# Patient Record
Sex: Male | Born: 1980 | Race: Black or African American | Hispanic: No | Marital: Single | Smoking: Current every day smoker
Health system: Southern US, Community
[De-identification: ages and names within clinical notes are randomized; demographics above are authoritative.]

## PROBLEM LIST (undated history)

## (undated) DIAGNOSIS — J302 Other seasonal allergic rhinitis: Secondary | ICD-10-CM

---

## 2014-10-04 ENCOUNTER — Emergency Department (HOSPITAL_COMMUNITY)
Admission: EM | Admit: 2014-10-04 | Discharge: 2014-10-04 | Disposition: A | Discharge status: Home / Self Care | Attending: Emergency Medicine | Admitting: Emergency Medicine

## 2014-10-04 ENCOUNTER — Encounter (HOSPITAL_COMMUNITY): Payer: Self-pay | Admitting: *Deleted

## 2014-10-04 ENCOUNTER — Emergency Department (HOSPITAL_COMMUNITY)

## 2014-10-04 DIAGNOSIS — R55 Syncope and collapse: Secondary | ICD-10-CM

## 2014-10-04 DIAGNOSIS — R200 Anesthesia of skin: Secondary | ICD-10-CM | POA: Insufficient documentation

## 2014-10-04 DIAGNOSIS — Z720 Tobacco use: Secondary | ICD-10-CM | POA: Diagnosis not present

## 2014-10-04 HISTORY — DX: Other seasonal allergic rhinitis: J30.2

## 2014-10-04 LAB — BASIC METABOLIC PANEL
ANION GAP: 4 — AB (ref 5–15)
BUN: 13 mg/dL (ref 6–20)
CO2: 28 mmol/L (ref 22–32)
Calcium: 9.3 mg/dL (ref 8.9–10.3)
Chloride: 107 mmol/L (ref 101–111)
Creatinine, Ser: 1.38 mg/dL — ABNORMAL HIGH (ref 0.61–1.24)
GFR calc Af Amer: >60 mL/min (ref >60)
GLUCOSE: 99 mg/dL (ref 65–99)
POTASSIUM: 3.6 mmol/L (ref 3.5–5.1)
Sodium: 139 mmol/L (ref 135–145)

## 2014-10-04 LAB — CBC WITH DIFFERENTIAL/PLATELET
BASOS ABS: 0 10*3/uL (ref 0.0–0.1)
Basophils Relative: 0 % (ref 0–1)
EOS PCT: 3 % (ref 0–5)
Eosinophils Absolute: 0.3 10*3/uL (ref 0.0–0.7)
HEMATOCRIT: 44.3 % (ref 39.0–52.0)
Hemoglobin: 14.6 g/dL (ref 13.0–17.0)
LYMPHS PCT: 34 % (ref 12–46)
Lymphs Abs: 3.2 10*3/uL (ref 0.7–4.0)
MCH: 28.3 pg (ref 26.0–34.0)
MCHC: 33 g/dL (ref 30.0–36.0)
MCV: 86 fL (ref 78.0–100.0)
Monocytes Absolute: 1.1 10*3/uL — ABNORMAL HIGH (ref 0.1–1.0)
Monocytes Relative: 12 % (ref 3–12)
NEUTROS ABS: 4.7 10*3/uL (ref 1.7–7.7)
Neutrophils Relative %: 51 % (ref 43–77)
PLATELETS: 206 10*3/uL (ref 150–400)
RBC: 5.15 MIL/uL (ref 4.22–5.81)
RDW: 13.8 % (ref 11.5–15.5)
WBC: 9.4 10*3/uL (ref 4.0–10.5)

## 2014-10-04 NOTE — Discharge Instructions (Signed)
The syncopal event that occurred while playing sports is potential concern for a heart problem. No sports or running until cleared by cardiology. Referral information provided cardiology locally. Return for any new or worse symptoms. Chest x-ray was negative labs without significant abnormalities cardiac monitoring without arrhythmia EKG without acute changes.

## 2014-10-04 NOTE — ED Notes (Signed)
Pt passed out while playing basketball, pt c/o HA and neck pain, pt unable to stand from stretcher to bed on arrival

## 2014-10-04 NOTE — ED Notes (Signed)
MD at bedside. 

## 2014-10-04 NOTE — ED Notes (Signed)
1 liter bolus of NS bolus started

## 2014-10-04 NOTE — ED Provider Notes (Signed)
CSN: [id number]     Arrival date & time 10/04/14  1937 History   First MD Initiated Contact with Patient 10/04/14 1941     Chief Complaint  Patient presents with  . Loss of Consciousness     (Consider location/radiation/quality/duration/timing/severity/associated sxs/prior Treatment) Patient is a 34 y.o. male presenting with syncope. The history is provided by the patient.  Loss of Consciousness Associated symptoms: no chest pain, no dizziness, no fever, no headaches, no nausea, no shortness of breath, no vomiting and no weakness    patient is a prison inmate. Patient passed out while playing basketball. In triage a mention headache and neck pain but denies that to me. Does not concerned about hurting anything does not know why he passed out. Patient felt fine earlier. Never had this happen before.  Past Medical History  Diagnosis Date  . Seasonal allergies    History reviewed. No pertinent past surgical history. History reviewed. No pertinent family history. Social History  Substance Use Topics  . Smoking status: Current Every Day Smoker  . Smokeless tobacco: None  . Alcohol Use: No    Review of Systems  Constitutional: Negative for fever.  HENT: Negative for congestion.   Eyes: Negative for visual disturbance.  Respiratory: Negative for shortness of breath.   Cardiovascular: Positive for syncope. Negative for chest pain.  Gastrointestinal: Negative for nausea, vomiting and abdominal pain.  Genitourinary: Negative for dysuria and hematuria.  Musculoskeletal: Negative for back pain and neck pain.  Skin: Negative for rash.  Neurological: Positive for syncope and numbness. Negative for dizziness, weakness and headaches.  Hematological: Does not bruise/bleed easily.      Allergies  Shellfish allergy  Home Medications   Prior to Admission medications   Not on File   BP 139/91 mmHg  Pulse 51  Temp(Src) 98.4 F (36.9 C) (Oral)  Resp 14  Ht  (1.956 m)  Wt 195  lb (88.451 kg)  BMI 23.12 kg/m2  SpO2 99% Physical Exam  Constitutional: He is oriented to person, place, and time. He appears well-developed and well-nourished. No distress.  HENT:  Head: Normocephalic and atraumatic.  Mouth/Throat: Oropharynx is clear and moist.  Eyes: Conjunctivae and EOM are normal. Pupils are equal, round, and reactive to light.  Neck: Normal range of motion. Neck supple.  Cardiovascular: Normal rate, regular rhythm and normal heart sounds.   No murmur heard. Pulmonary/Chest: Effort normal and breath sounds normal. No respiratory distress.  Abdominal: Soft. Bowel sounds are normal. There is no tenderness.  Musculoskeletal: Normal range of motion. He exhibits no edema or tenderness.  Neurological: He is alert and oriented to person, place, and time. No cranial nerve deficit. He exhibits normal muscle tone. Coordination normal.  Skin: Skin is warm. No rash noted.  Nursing note and vitals reviewed.   ED Course  Procedures (including critical care time) Labs Review Labs Reviewed  CBC WITH DIFFERENTIAL/PLATELET - Abnormal; Notable for the following:    Monocytes Absolute 1.1 (*)    All other components within normal limits  BASIC METABOLIC PANEL - Abnormal; Notable for the following:    Creatinine, Ser 1.38 (*)    Anion gap 4 (*)    All other components within normal limits   Results for orders placed or performed during the hospital encounter of 10/04/14  CBC with Differential  Result Value Ref Range   WBC 9.4 4.0 - 10.5 K/uL   RBC 5.15 4.22 - 5.81 MIL/uL   Hemoglobin 14.6 13.0 - 17.0 g/dL  HCT 44.3 39.0 - 52.0 %   MCV 86.0 78.0 - 100.0 fL   MCH 28.3 26.0 - 34.0 pg   MCHC 33.0 30.0 - 36.0 g/dL   RDW 16.1 09.6 - 04.5 %   Platelets 206 150 - 400 K/uL   Neutrophils Relative % 51 43 - 77 %   Neutro Abs 4.7 1.7 - 7.7 K/uL   Lymphocytes Relative 34 12 - 46 %   Lymphs Abs 3.2 0.7 - 4.0 K/uL   Monocytes Relative 12 3 - 12 %   Monocytes Absolute 1.1 (H) 0.1  - 1.0 K/uL   Eosinophils Relative 3 0 - 5 %   Eosinophils Absolute 0.3 0.0 - 0.7 K/uL   Basophils Relative 0 0 - 1 %   Basophils Absolute 0.0 0.0 - 0.1 K/uL  Basic metabolic panel  Result Value Ref Range   Sodium 139 135 - 145 mmol/L   Potassium 3.6 3.5 - 5.1 mmol/L   Chloride 107 101 - 111 mmol/L   CO2 28 22 - 32 mmol/L   Glucose, Bld 99 65 - 99 mg/dL   BUN 13 6 - 20 mg/dL   Creatinine, Ser 4.09 (H) 0.61 - 1.24 mg/dL   Calcium 9.3 8.9 - 81.1 mg/dL   GFR calc non Af Amer >60 >60 mL/min   GFR calc Af Amer >60 >60 mL/min   Anion gap 4 (L) 5 - 15     Imaging Review Dg Chest 2 View  10/04/2014   CLINICAL DATA:  Syncope while playing basketball. Headache and neck pain.  EXAM: CHEST  2 VIEW  COMPARISON:  None.  FINDINGS: The cardiomediastinal contours are normal. The lungs are clear. Pulmonary vasculature is normal. No consolidation, pleural effusion, or pneumothorax. No acute osseous abnormalities are seen.  IMPRESSION: No acute pulmonary process.   Electronically Signed   By: Rubye Oaks M.D.   On: 10/04/2014 21:25   I have personally reviewed and evaluated these images and lab results as part of my medical decision-making.   EKG Interpretation   Date/Time:  Friday October 04 2014 20:09:42 EDT Ventricular Rate:  61 PR Interval:  179 QRS Duration: 95 QT Interval:  389 QTC Calculation: 392 R Axis:   88 Text Interpretation:  Sinus rhythm Consider left atrial enlargement ST  elev, probable normal early repol pattern No previous ECGs available  Confirmed by Soledad Budreau  MD, Soundra Lampley [phone]) on 10/04/2014 8:15:38 PM      MDM   Final diagnoses:  Syncope, unspecified syncope type    Patient with a syncopal episode while playing basketball. Patient in triage mentioned headache and neck pain but denied any of that to me. Is not concerned about anything that got hurt. Does have numbness to of the of left leg in a glovelike fashion which does not fit none neurological pattern. Main  concern would be the syncope and a fairly young black male. EKG without any acute changes also no of arrhythmias on cardiac monitoring chest x-rays negative. Basic labs without any significant abnormalities. However recommend cardiology follow-up for echocardiogram. To rule out any congenital heart problems that could've led to the syncope. Patient given of restrictive note for no strenuous activity no running nose sports or gym class. Referral to cardiology provided. However patient is prison systems so he maybe be referred within their system.    Vanetta Mulders, MD 10/04/14 2141

## 2014-10-12 ENCOUNTER — Emergency Department (HOSPITAL_COMMUNITY)

## 2014-10-12 ENCOUNTER — Emergency Department (HOSPITAL_COMMUNITY)
Admission: EM | Admit: 2014-10-12 | Discharge: 2014-10-12 | Disposition: A | Discharge status: Home / Self Care | Attending: Emergency Medicine | Admitting: Emergency Medicine

## 2014-10-12 ENCOUNTER — Encounter (HOSPITAL_COMMUNITY): Payer: Self-pay | Admitting: *Deleted

## 2014-10-12 DIAGNOSIS — Z79899 Other long term (current) drug therapy: Secondary | ICD-10-CM | POA: Diagnosis not present

## 2014-10-12 DIAGNOSIS — R002 Palpitations: Secondary | ICD-10-CM

## 2014-10-12 DIAGNOSIS — R079 Chest pain, unspecified: Secondary | ICD-10-CM | POA: Diagnosis present

## 2014-10-12 DIAGNOSIS — Z720 Tobacco use: Secondary | ICD-10-CM | POA: Insufficient documentation

## 2014-10-12 LAB — CBC
HEMATOCRIT: 45 % (ref 39.0–52.0)
Hemoglobin: 14.8 g/dL (ref 13.0–17.0)
MCH: 28.3 pg (ref 26.0–34.0)
MCHC: 32.9 g/dL (ref 30.0–36.0)
MCV: 86 fL (ref 78.0–100.0)
PLATELETS: 197 10*3/uL (ref 150–400)
RBC: 5.23 MIL/uL (ref 4.22–5.81)
RDW: 13.7 % (ref 11.5–15.5)
WBC: 7 10*3/uL (ref 4.0–10.5)

## 2014-10-12 LAB — BASIC METABOLIC PANEL
Anion gap: 6 (ref 5–15)
BUN: 12 mg/dL (ref 6–20)
CALCIUM: 9 mg/dL (ref 8.9–10.3)
CO2: 29 mmol/L (ref 22–32)
CREATININE: 1.06 mg/dL (ref 0.61–1.24)
Chloride: 105 mmol/L (ref 101–111)
GFR calc Af Amer: >60 mL/min (ref >60)
Glucose, Bld: 98 mg/dL (ref 65–99)
POTASSIUM: 4.1 mmol/L (ref 3.5–5.1)
SODIUM: 140 mmol/L (ref 135–145)

## 2014-10-12 LAB — TROPONIN I

## 2014-10-12 LAB — D-DIMER, QUANTITATIVE (NOT AT ARMC): D DIMER QUANT: 0.78 ug{FEU}/mL — AB (ref 0.00–0.48)

## 2014-10-12 MED ORDER — IOHEXOL 350 MG/ML SOLN
100.0000 mL | Freq: Once | INTRAVENOUS | Status: AC | PRN
  Administered 2014-10-12: 100 mL via INTRAVENOUS

## 2014-10-12 NOTE — ED Notes (Signed)
Pt was recently seen for episode of syncope. Pt states he felt his heart fluttering last night with continuation into today with chest pain starting this morning. Pt states he has no chest pain at the moment but does feel fluttering in his chest. NAD noted. Pt is accompanied by jail officers.

## 2014-10-12 NOTE — ED Notes (Signed)
Nurse gave report to nurse dingle at [facility].

## 2014-10-12 NOTE — Discharge Instructions (Signed)
°Emergency Department Resource Guide °1) Find a Doctor and Pay Out of Pocket °Although you won't have to find out who is covered by your insurance plan, it is a good idea to ask around and get recommendations. You will then need to call the office and see if the doctor you have chosen will accept you as a new patient and what types of options they offer for patients who are self-pay. Some doctors offer discounts or will set up payment plans for their patients who do not have insurance, but you will need to ask so you aren't surprised when you get to your appointment. ° °2) Contact Your Local Health Department °Not all health departments have doctors that can see patients for sick visits, but many do, so it is worth a call to see if yours does. If you don't know where your local health department is, you can check in your phone book. The CDC also has a tool to help you locate your state's health department, and many state websites also have listings of all of their local health departments. ° °3) Find a Walk-in Clinic °If your illness is not likely to be very severe or complicated, you may want to try a walk in clinic. These are popping up all over the country in pharmacies, drugstores, and shopping centers. They're usually staffed by nurse practitioners or physician assistants that have been trained to treat common illnesses and complaints. They're usually fairly quick and inexpensive. However, if you have serious medical issues or chronic medical problems, these are probably not your best option. ° °No Primary Care Doctor: °- Call Health Connect at  832-8000 - they can help you locate a primary care doctor that  accepts your insurance, provides certain services, etc. °- Physician Referral Service- 1-800-533-3463 ° °Chronic Pain Problems: °Organization         Address  Phone   Notes  °[facility]  ([phone]) [phone] Patients need to be referred by their primary care doctor.  ° °Medication  Assistance: °Organization         Address  Phone   Notes  °[place] [facility] [address] °[place], [place] [address] (336) 641-8030 --Must be a resident of [place] °-- Must have NO insurance coverage whatsoever (no Medicaid/ Medicare, etc.) °-- The pt. MUST have a primary care doctor that directs their care regularly and follows them in the community °  °[facility]  (866) 331-1348   °[employer]  (888) 892-1162   ° °Agencies that provide inexpensive medical care: °Organization         Address  Phone   Notes  °[facility]  (336) 832-8035   °[facility]    (336) 832-7272   °[facility] [address] °[place], [place] [address] (336) 832-4777   °[facility] [address], °[place] (336) 271-4999   °[facility]    (336) 373-0678   °[facility]    (336) 272-1050   °[facility] ° [address], [place] Phone:  (336) 832-4444, Fax:  (336) 832-4440 Hours of Operation:  9 am - 6 pm, M-F.  Also accepts Medicaid/Medicare and self-pay.  °[facility] ° [address], [place] Phone: (336) [phone], Fax: (336) [phone]. Hours of Operation:  8:30 am - 5:30 pm, M-F.  Also accepts Medicaid and self-pay.  °[facility] [place] [address], [place] Phone: (336) 878-6027   °[facility] [address], [place], [place] (336)723-1848, Ext. 123 Mondays & Thursdays: 7-9 AM.  First 15 patients are seen on a first come, first serve basis. °  ° °Medicaid-accepting [place] Providers: ° °Organization         Address  Phone   Notes  °[facility] [address], [place] (336) 641-2100 Also accepts self-pay patients.  °[facility] [address], [place] ° (336) 856-9996   °[facility] [address], [place]  (336) 288-8857   °[facility] [address] [place] [address], [place] (336) 299-7000   °Veita Bland [address], [place]  ° (336) 373-1557 Only accepts [place] Access Medicaid patients after they have their name applied to their card.  ° °Self-Pay (no insurance) in [place]: ° °Organization         Address  Phone   Notes  °Sickle Cell Patients, [facility] [address], [place] (336) 832-1970   °[facility] [address], [place] (336) 832-4400   °[facility] ° [address], [place] (336) 992-4800   °[facility]/Dr. Osei-Bonsu ° [address] [place] [address], [place] or [address], [place] (336) [phone] Phone number for both [place] and [place] locations is the same.  °[facility] [address], [place] (336) 299-0000   °[facility] [address] [place] [address], [place] or [address] (336) 852-7530 °(336) 878-2260   °[facility] [address], [place] (336) 350-1642, phone; (336) 294-5005, fax Sees patients 1st and 3rd Saturday of every month.  Must not qualify for public or private insurance (i.e. Medicaid, Medicare, [place] Health Choice, Veterans' Benefits) • Household income should be no more than 200% of the poverty level •The clinic cannot treat you if you are pregnant or think you are pregnant • Sexually transmitted diseases are not treated at the clinic.  ° ° °Dental Care: °Organization         Address  Phone  Notes  °[place] [facility] [address], [place] (336) 641-6152 Accepts children up to age 21 who are enrolled in Medicaid or [place] Health Choice; pregnant women with a Medicaid card; and children who have applied for Medicaid or [place] Health Choice, but were declined, whose parents can pay a reduced fee at time of service.  °[place]  [facility] [place]  [address], [place] (336) 641-7733 Accepts children up to age 21 who are enrolled in Medicaid or [place] Health Choice; pregnant women with a Medicaid card; and children who have applied for Medicaid or [place] Health Choice, but were declined, whose parents can pay a reduced fee at time of service.  °[facility] ° [address], [place] (336) 641-4533 Patients are seen by appointment only. Walk-ins are not accepted. [facility] will see patients 18 years of age and older. °Monday - Tuesday (8am-5pm) °Most Wednesdays (8:30-5pm) °$30 per visit, cash only  °[facility] ° [address], [place] (336) 641-4533 Patients are seen by appointment only. Walk-ins are not accepted. [facility] will see patients 18 years of age and older. °One   Wednesday Evening (Monthly: Volunteer Based).  $30 per visit, cash only  °[facility]  (919) [phone] for adults; Children under age 4, call Graduate Pediatric Dentistry at ([phone]) [phone]. Children aged 4-14, please call ([phone]) [phone] to request a pediatric application. ° Dental services are provided in all areas of dental care including fillings, crowns and bridges, complete and partial dentures, implants, gum treatment, root canals, and extractions. Preventive care is also provided. Treatment is provided to both adults and children. °Patients are selected via a lottery and there is often a waiting list. °  °[facility] [address], °[place] ° (336) 763-8833 [email or web address] °  °[facility] [address], [place], [place] (336)723-1848, Ext. 123 Second and Fourth Thursday of each month, opens at 6:30 AM; Clinic ends at 9 AM.  Patients are seen on a first-come first-served basis, and a limited number are seen during each clinic.  ° °[facility] ° [address], [place], [place] (336) [phone]    Eligibility Requirements °You must have lived in [place], [place], or [place] counties for at least the last three months. °  You cannot be eligible for state or federal sponsored healthcare insurance, including Veterans Administration, Medicaid, or Medicare. °  You generally cannot be eligible for healthcare insurance through your employer.  °  How to apply: °Eligibility screenings are held every Tuesday and Wednesday afternoon from 1:00 pm until 4:00 pm. You do not need an appointment for the interview!  °[facility] [address], [place], [place] 336-631-2330   °[facility]  336-342-8273   °[facility]  336-703-3100   °[facility]  336-570-6415   ° °Behavioral Health Resources in the Community: °Intensive Outpatient Programs °Organization         Address  Phone  Notes  °[facility] [address], [place], [place] 336-878-6098   °[facility] [address], [place], [place] 336-832-9800   °[facility] [address], [place], [place] ° 336-882-2125   °[facility] [address],  °[place], [place] 1-800-853-5163 or 336-641-4981   °Substance Abuse Resources °Organization         Address  Phone  Notes  °[facility]  336-882-2125   °[facility]  336-784-9470   °[facility]  336-285-9073   °[facility]  336-845-3988   °Residential & Outpatient Substance Abuse Program  1-800-659-3381   °Psychological Services °Organization         Address  Phone  Notes  °[facility]  336- 832-9600   °[facility]  336- 378-7881   °[facility] [address], [place] 1-800-853-5163 or 336-641-4981   ° °Mobile Crisis Teams °Organization         Address  Phone  Notes  °[facility], Mobile Crisis Care Unit  1-877-626-1772   °Assertive °[facility] ° [address]  [place], [place] 336-834-9664   °Sharon DeEsch [address] °[place] 336-554-5454   ° °Self-Help/Support Groups °Organization         Address  Phone             Notes  °[facility]  - variety of support groups  336- 373-1402 Call for more information  °Narcotics Anonymous (NA), [facility] [address], °[place]  2 meetings at this location  ° °  Residential Treatment Programs Organization         Address  Phone  Notes  [facility] [address]    [place]  4-098-119-1478   [facility]  [address] [place] [address], [place], [place] 308-657-8469   [facility] [address] [place], [place] 629-528-4132 Admissions: 8am-3pm M-F  [facility] [address],    [place], [place] 440-102-7253   [facility] [address] [place], [place], [place] 664-403-4742   [facility] [address],  [place], [place] 595-638-7564   [facility] - Intensive Outpatient [address], [place], [place] 332-951-8841   [facility] [address] [place]  [place], [place] 6-606-301-6010 or 902 228 3933   [facility] ([facility] [address], [place], [place] 025-427-0623 Accepts Medicaid  [facility] [address]  [place] 7-628-315-1761 Substance Abuse/Addiction Treatment   [facility]         Address  Phone  Notes  [facility]  431-245-3943   Angie Fava, PhD [address] [place], [place]   (938)233-0209 or 8020499614   [facility]   [address] [place], [place] 438-675-1774   [facility] [address] [place], [place] [phone] Insurance/Medicaid/sponsorship through [facility] [address]                                    [place], [place] [phone] Therapy/tele-psych/case    [facility] [address], [place] (228)[phone]    Dr. Lolly Mustache  [phone]   [facility]  [employer] [facility] Dept. 1) [address] [place] 2) [address] [place] 3)  [address], [place] [phone] (424)[phone]  [phone]   [facility] Child Abuse Hotline 661-403-8064 or 650-792-8325 (After Hours)      Avoid avoid caffinated products, such as teas, colas, coffee, chocolate. Avoid over the counter cold medicines, herbal or "natural vitamin" products, and illicit drugs because they can contain stimulants.  Call your regular medical doctor and the Cardiologist on Monday to schedule a follow up appointment this week.  Return to the Emergency Department immediately if worsening.

## 2014-10-12 NOTE — ED Provider Notes (Signed)
CSN: [id number]     Arrival date & time 10/12/14  1509 History   First MD Initiated Contact with Patient 10/12/14 1530     Chief Complaint  Patient presents with  . Chest Pain      HPI Pt was seen at 1535. Per pt, c/o gradual onset and persistence of multiple intermittent episodes of "palpitations" since overnight last night. Pt states his "heart has been fluttering on and off." Has been associated with chest "pain." Denies SOB/cough, no abd pain, no N/V/D, no lightheadedness.    Past Medical History  Diagnosis Date  . Seasonal allergies    History reviewed. No pertinent past surgical history.  Social History  Substance Use Topics  . Smoking status: Current Every Day Smoker  . Smokeless tobacco: None  . Alcohol Use: No    Review of Systems ROS: Statement: All systems negative except as marked or noted in the HPI; Constitutional: Negative for fever and chills. ; ; Eyes: Negative for eye pain, redness and discharge. ; ; ENMT: Negative for ear pain, hoarseness, nasal congestion, sinus pressure and sore throat. ; ; Cardiovascular: +palpitations, CP. Negative for diaphoresis, dyspnea and peripheral edema. ; ; Respiratory: Negative for cough, wheezing and stridor. ; ; Gastrointestinal: Negative for nausea, vomiting, diarrhea, abdominal pain, blood in stool, hematemesis, jaundice and rectal bleeding. . ; ; Genitourinary: Negative for dysuria, flank pain and hematuria. ; ; Musculoskeletal: Negative for back pain and neck pain. Negative for swelling and trauma.; ; Skin: Negative for pruritus, rash, abrasions, blisters, bruising and skin lesion.; ; Neuro: Negative for headache, lightheadedness and neck stiffness. Negative for weakness, altered level of consciousness , altered mental status, extremity weakness, paresthesias, involuntary movement, seizure and syncope.      Allergies  Shellfish allergy  Home Medications   Prior to Admission medications   Medication Sig Start Date End Date  Taking? Authorizing Provider  cetirizine (ZYRTEC) 10 MG tablet Take 10 mg by mouth daily.   Yes Historical Provider, MD  ranitidine (ZANTAC) 300 MG tablet Take 300 mg by mouth 2 (two) times daily.   Yes Historical Provider, MD  terbinafine (LAMISIL) 250 MG tablet Take 250 mg by mouth daily.   Yes Historical Provider, MD   BP 148/84 mmHg  Pulse 67  Temp(Src) 97.9 F (36.6 C) (Oral)  Resp 16  Ht 6\' 6"  (1.981 m)  Wt 196 lb (88.905 kg)  BMI 22.65 kg/m2  SpO2 100%   16:04:39 Orthostatic Vital Signs LR  Orthostatic Lying  - BP- Lying: 148/84 mmHg ; Pulse- Lying: 63  Orthostatic Sitting - BP- Sitting: 145/92 mmHg ; Pulse- Sitting: 68  Orthostatic Standing at 0 minutes - BP- Standing at 0 minutes: 147/92 mmHg ; Pulse- Standing at 0 minutes: 66      Physical Exam  1540: Physical examination:  Nursing notes reviewed; Vital signs and O2 SAT reviewed;  Constitutional: Well developed, Well nourished, Well hydrated, In no acute distress; Head:  Normocephalic, atraumatic; Eyes: EOMI, PERRL, No scleral icterus; ENMT: Mouth and pharynx normal, Mucous membranes moist; Neck: Supple, Full range of motion, No lymphadenopathy; Cardiovascular: Regular rate and rhythm, No murmur, rub, or gallop. HR 60's/70's during my exam; Respiratory: Breath sounds clear & equal bilaterally, No rales, rhonchi, wheezes.  Speaking full sentences with ease, Normal respiratory effort/excursion; Chest: Nontender, Movement normal; Abdomen: Soft, Nontender, Nondistended, Normal bowel sounds; Genitourinary: No CVA tenderness; Extremities: Pulses normal, No tenderness, No edema, No calf edema or asymmetry.; Neuro: AA&Ox3, Major CN grossly intact.  Speech  clear. No gross focal motor or sensory deficits in extremities.; Skin: Color normal, Warm, Dry.   ED Course  Procedures (including critical care time) Labs Review   Imaging Review  I have personally reviewed and evaluated these images and lab results as part of my medical  decision-making.   EKG Interpretation   Date/Time:  Saturday October 12 2014 15:17:22 EDT Ventricular Rate:  60 PR Interval:  172 QRS Duration: 85 QT Interval:  391 QTC Calculation: 391 R Axis:   88 Text Interpretation:  Sinus rhythm Normal ECG Confirmed by POLLINA  MD,  CHRISTOPHER [phone]) on 10/12/2014 3:19:34 PM      MDM  MDM Reviewed: previous chart, nursing note and vitals Reviewed previous: labs and ECG Interpretation: labs, ECG, x-ray and CT scan   Results for orders placed or performed during the hospital encounter of 10/12/14  Basic metabolic panel  Result Value Ref Range   Sodium 140 135 - 145 mmol/L   Potassium 4.1 3.5 - 5.1 mmol/L   Chloride 105 101 - 111 mmol/L   CO2 29 22 - 32 mmol/L   Glucose, Bld 98 65 - 99 mg/dL   BUN 12 6 - 20 mg/dL   Creatinine, Ser 6.04 0.61 - 1.24 mg/dL   Calcium 9.0 8.9 - 54.0 mg/dL   GFR calc non Af Amer >60 >60 mL/min   GFR calc Af Amer >60 >60 mL/min   Anion gap 6 5 - 15  CBC  Result Value Ref Range   WBC 7.0 4.0 - 10.5 K/uL   RBC 5.23 4.22 - 5.81 MIL/uL   Hemoglobin 14.8 13.0 - 17.0 g/dL   HCT 98.1 19.1 - 47.8 %   MCV 86.0 78.0 - 100.0 fL   MCH 28.3 26.0 - 34.0 pg   MCHC 32.9 30.0 - 36.0 g/dL   RDW 29.5 62.1 - 30.8 %   Platelets 197 150 - 400 K/uL  Troponin I  Result Value Ref Range   Troponin I <0.03 <0.031 ng/mL  D-dimer, quantitative  Result Value Ref Range   D-Dimer, Quant 0.78 (H) 0.00 - 0.48 ug/mL-FEU   Dg Chest 2 View 10/12/2014   CLINICAL DATA:  Chest pain for 1 day.  EXAM: CHEST  2 VIEW  COMPARISON:  10/04/2014  FINDINGS: The cardiomediastinal silhouette is unremarkable.  There is no evidence of focal airspace disease, pulmonary edema, suspicious pulmonary nodule/mass, pleural effusion, or pneumothorax. No acute bony abnormalities are identified.  IMPRESSION: No active cardiopulmonary disease.   Electronically Signed   By: Harmon Pier M.D.   On: 10/12/2014 16:09   Ct Angio Chest Pe W/cm &/or Wo Cm 10/12/2014    CLINICAL DATA:  Palpitations, elevated D-dimer  EXAM: CT ANGIOGRAPHY CHEST WITH CONTRAST  TECHNIQUE: Multidetector CT imaging of the chest was performed using the standard protocol during bolus administration of intravenous contrast. Multiplanar CT image reconstructions and MIPs were obtained to evaluate the vascular anatomy.  CONTRAST:  OMNIPAQUE IOHEXOL 350 MG/ML SOLN  COMPARISON:  None.  FINDINGS: There is adequate opacification of the pulmonary arteries. There is no pulmonary embolus. The main pulmonary artery, right main pulmonary artery and left main pulmonary arteries are normal in size. The heart size is normal. There is no pericardial effusion. The thoracic aorta is normal in caliber.  The lungs are clear. There is no focal consolidation, pleural effusion or pneumothorax.  There is no axillary, hilar, or mediastinal adenopathy.  There is no lytic or blastic osseous lesion.  The visualized portions of  the upper abdomen are unremarkable.  Review of the MIP images confirms the above findings.  IMPRESSION: 1. No evidence of a pulmonary embolus.   Electronically Signed   By: Elige Ko   On: 10/12/2014 16:47      1610:  Pt states he had an episode of "heart fluttering right before I went to XR." Monitor strips reviewed by myself and ED RN: Pt was in NSR, rate 60-70's, no ectopy during that time.   1700:  Monitor remained NSR, rate 60-70's, while in the ED. VSS. Workup reassuring. Will need to f/u with Cards MD as instructed when seen in the ED last week for syncopal episode; verb understanding. Will [facility]/c back to [facility] with [employer].     Samuel Jester, DO 10/15/14 1754

## 2014-10-12 NOTE — ED Notes (Signed)
Pt states he had episode of "fluttering" before going to CXR. EDP and nurse verified heart rhythm via records. Pt was in NSR on all heart records.

## 2017-04-06 IMAGING — CT CT ANGIO CHEST
2 of 6 series · 6 of 36 positions shown · IV contrast (Omnipaque 300)
Comparison: None.

CLINICAL DATA: Palpitations, elevated D-dimer

EXAM:
CT ANGIOGRAPHY CHEST WITH CONTRAST
TECHNIQUE: Multidetector CT imaging of the chest was performed using the
standard protocol during bolus administration of intravenous
contrast. Multiplanar CT image reconstructions and MIPs were
obtained to evaluate the vascular anatomy.
CONTRAST:  100mL OMNIPAQUE IOHEXOL 350 MG/ML SOLN

[Series 4: pe 3.0 b40f · axial · 0.72mm/px · z∈[+173,+401]mm · 5 of 116 slices shown]
[im 20/116  lung]
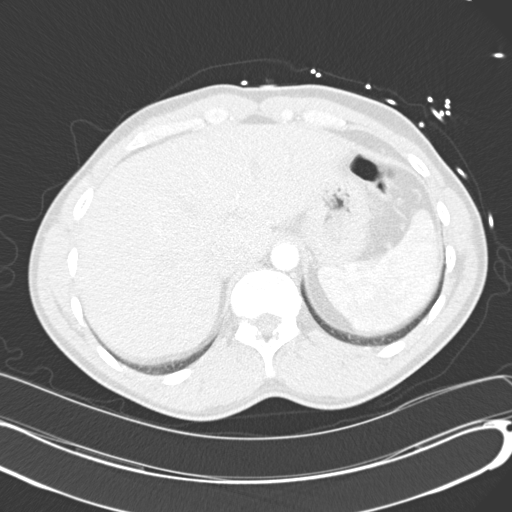
[im 39/116  mediastinal]
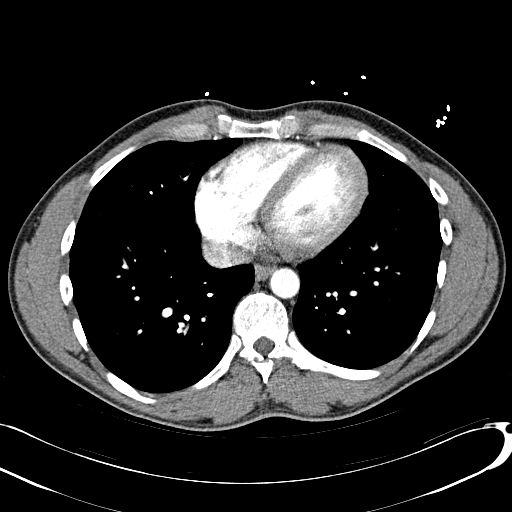
[im 58/116  lung]
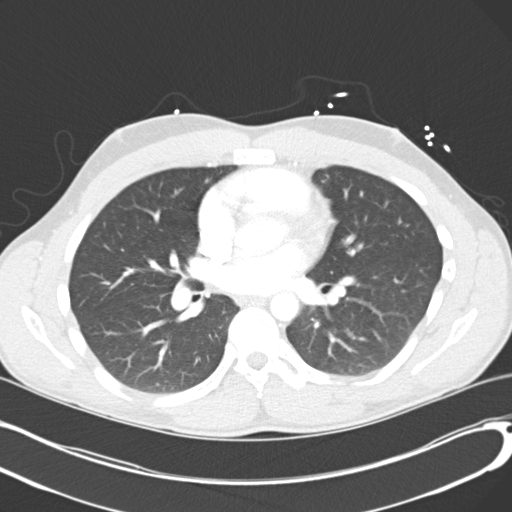
[im 77/116  mediastinal]
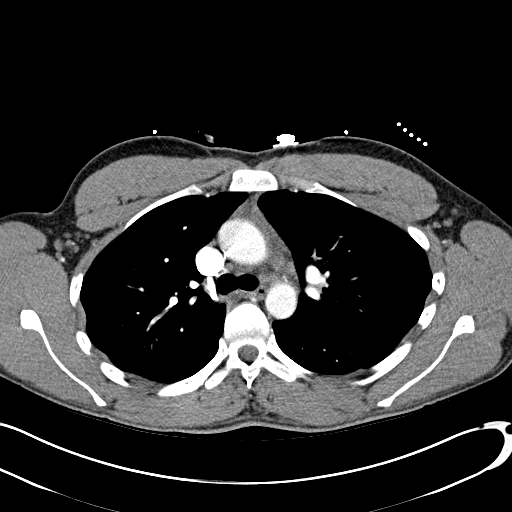
[im 96/116  lung]
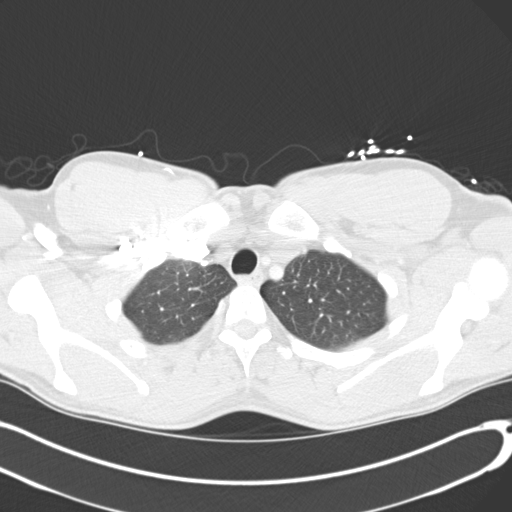

[Series 6: mpr coronal pe 3mm · coronal · 0.67mm/px · 1 of 78 slices shown]
[im 39/78  mediastinal]
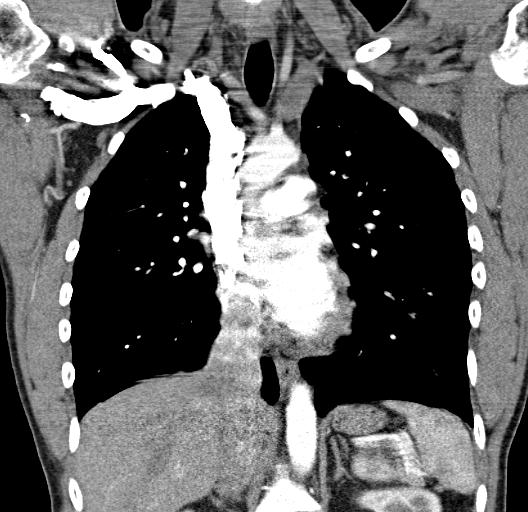

[6 of 36 positions shown; findings below may reference images not displayed]

FINDINGS: There is adequate opacification of the pulmonary arteries. There is
no pulmonary embolus. The main pulmonary artery, right main
pulmonary artery and left main pulmonary arteries are normal in
size. The heart size is normal. There is no pericardial effusion.
The thoracic aorta is normal in caliber.

The lungs are clear. There is no focal consolidation, pleural
effusion or pneumothorax.

There is no axillary, hilar, or mediastinal adenopathy.

There is no lytic or blastic osseous lesion.

The visualized portions of the upper abdomen are unremarkable.

Review of the MIP images confirms the above findings.
IMPRESSION: 1. No evidence of a pulmonary embolus.
